# Patient Record
Sex: Female | Born: 1970 | Race: White | Hispanic: Yes | Marital: Married | Smoking: Never smoker
Health system: Southern US, Community
[De-identification: ages and names within clinical notes are randomized; demographics above are authoritative.]

## PROBLEM LIST (undated history)

## (undated) DIAGNOSIS — R87629 Unspecified abnormal cytological findings in specimens from vagina: Secondary | ICD-10-CM

## (undated) DIAGNOSIS — N63 Unspecified lump in unspecified breast: Secondary | ICD-10-CM

## (undated) DIAGNOSIS — T7840XA Allergy, unspecified, initial encounter: Secondary | ICD-10-CM

## (undated) DIAGNOSIS — F419 Anxiety disorder, unspecified: Secondary | ICD-10-CM

## (undated) HISTORY — PX: BREAST EXCISIONAL BIOPSY: SUR124

## (undated) HISTORY — PX: BREAST LUMPECTOMY: SHX2

## (undated) HISTORY — DX: Anxiety disorder, unspecified: F41.9

## (undated) HISTORY — DX: Unspecified abnormal cytological findings in specimens from vagina: R87.629

## (undated) HISTORY — PX: BREAST MASS EXCISION: SHX1267

## (undated) HISTORY — PX: NOSE SURGERY: SHX723

## (undated) HISTORY — DX: Allergy, unspecified, initial encounter: T78.40XA

---

## 2017-01-16 ENCOUNTER — Other Ambulatory Visit: Payer: Self-pay | Admitting: Medical

## 2017-01-16 DIAGNOSIS — Z1231 Encounter for screening mammogram for malignant neoplasm of breast: Secondary | ICD-10-CM

## 2017-01-17 ENCOUNTER — Ambulatory Visit (HOSPITAL_BASED_OUTPATIENT_CLINIC_OR_DEPARTMENT_OTHER)
Admission: RE | Admit: 2017-01-17 | Discharge: 2017-01-17 | Disposition: A | Discharge status: Home / Self Care | Payer: BLUE CROSS/BLUE SHIELD | Source: Ambulatory Visit | Attending: Medical | Admitting: Medical

## 2017-01-17 ENCOUNTER — Ambulatory Visit (INDEPENDENT_AMBULATORY_CARE_PROVIDER_SITE_OTHER): Payer: BLUE CROSS/BLUE SHIELD | Admitting: Medical

## 2017-01-17 ENCOUNTER — Encounter: Payer: Self-pay | Admitting: Medical

## 2017-01-17 ENCOUNTER — Encounter (HOSPITAL_BASED_OUTPATIENT_CLINIC_OR_DEPARTMENT_OTHER): Payer: Self-pay

## 2017-01-17 VITALS — BP 119/79 | HR 74 | Temp 98.4°F | Ht 59.0 in | Wt 124.6 lb

## 2017-01-17 DIAGNOSIS — F32A Depression, unspecified: Secondary | ICD-10-CM

## 2017-01-17 DIAGNOSIS — F329 Major depressive disorder, single episode, unspecified: Secondary | ICD-10-CM

## 2017-01-17 DIAGNOSIS — Z1231 Encounter for screening mammogram for malignant neoplasm of breast: Secondary | ICD-10-CM | POA: Insufficient documentation

## 2017-01-17 DIAGNOSIS — F419 Anxiety disorder, unspecified: Secondary | ICD-10-CM | POA: Diagnosis not present

## 2017-01-17 HISTORY — DX: Unspecified lump in unspecified breast: N63.0

## 2017-01-17 MED ORDER — ALPRAZOLAM 0.25 MG PO TABS: 0.2500 mg | ORAL_TABLET | Freq: Two times a day (BID) | ORAL | 0 refills | Status: DC | PRN

## 2017-01-17 MED ORDER — ESCITALOPRAM OXALATE 5 MG PO TABS: 5.0000 mg | ORAL_TABLET | Freq: Every day | ORAL | 3 refills | Status: DC

## 2017-01-17 NOTE — Progress Notes (Signed)
Subjective:    Patient ID: Deanna Valenzuela, female    DOB: Aug 14, 1971, 46 y.o.   MRN: [id number]  HPI I have reviewed pt PMH, PSH, FH, Social History and Surgical History.   Pt is not working, pt is tyring to walk or work out 3 times a week, trying to eat healthy, 1 cup of coffee in am. Pt has child 23 yo.  Pt states some recent stress fighting with her husband. Husband has been threatening to leave. 3 weeks ago when was at peak of stress and had chest pain and some back. Pt had normal ekg and cxr and bethany. Pt states no echo or ultrasound. Pt states her husband has been having employment problems and therefore very stressed and taking out frustration on her.(she feels). Pt states in past when was 46 yo had some depression and anxiety when her boyfriend left her.  Pt states 6 months ago had colitis. Pt went to [place]. She was treated and she states had negative colonoscopy(2018).  LMP- January 13, 2016.(no sex recently for many months).     Review of Systems  Constitutional: Negative for chills, fatigue and fever.  Respiratory: Negative for cough, choking, chest tightness, shortness of breath and wheezing.   Cardiovascular: Negative for chest pain and palpitations.  Gastrointestinal: Negative for abdominal pain, diarrhea, nausea and vomiting.  Musculoskeletal: Negative for back pain.  Skin: Negative for rash.  Neurological: Negative for dizziness, syncope, speech difficulty, weakness, light-headedness and headaches.  Hematological: Negative for adenopathy. Does not bruise/bleed easily.  Psychiatric/Behavioral: Positive for dysphoric mood. Negative for behavioral problems, confusion, sleep disturbance and suicidal ideas. The patient is nervous/anxious.     Past Medical History:  Diagnosis Date  . Allergy   . Anxiety      Social History   Social History  . Marital status: Married    Spouse name: N/A  . Number of children: N/A  . Years of education: N/A   Occupational History   . Not on file.   Social History Main Topics  . Smoking status: Never Smoker  . Smokeless tobacco: Never Used  . Alcohol use No  . Drug use: No  . Sexual activity: Yes   Other Topics Concern  . Not on file   Social History Narrative  . No narrative on file    Past Surgical History:  Procedure Laterality Date  . BREAST MASS EXCISION     Benign  . NOSE SURGERY     Asthetic    Family History  Problem Relation Age of Onset  . Hypertension Mother     Allergies not on file  No current outpatient prescriptions on file prior to visit.   No current facility-administered medications on file prior to visit.     BP 119/79   Pulse 74   Temp 98.4 F (36.9 C) (Oral)   Ht 4\' 11"  (1.499 m)   Wt 124 lb 9.6 oz (56.5 kg)   LMP 01/12/2017   SpO2 99%   BMI 25.17 kg/m       Objective:   Physical Exam  General Mental Status- Alert. General Appearance- Not in acute distress.   Skin General: Color- Normal Color. Moisture- Normal Moisture.  Neck Carotid Arteries- Normal color. Moisture- Normal Moisture. No carotid bruits. No JVD.  Chest and Lung Exam Auscultation: Breath Sounds:-Normal.  Cardiovascular Auscultation:Rythm- Regular. Murmurs & Other Heart Sounds:Auscultation of the heart reveals- No Murmurs.  Abdomen Inspection:-Inspeection Normal. Palpation/Percussion:Note:No mass. Palpation and Percussion of the abdomen reveal- Non  Tender, Non Distended + BS, no rebound or guarding.   Neurologic Cranial Nerve exam:- CN III-XII intact(No nystagmus), symmetric smile. Strength:- 5/5 equal and symmetric strength both upper and lower extremities.      Assessment & Plan:  For your stress, depression and anxiety will rx lexapro at dose of 50 mg a day. Also continue the xanax/alprozolam.  You can go ahead and schedule a complete physical today. Talk with [facility] spanish speaking staff to schedule.   Try to schedule CPE in one week or as needed  If any severe  recurrent chest pain then ED evaluation. If pain recurrent in future would refer to cardiologist. Currently will start to evaluate risk factors on CPE

## 2017-01-17 NOTE — Patient Instructions (Addendum)
For your stress, depression and anxiety will rx lexapro at dose of 50 mg a day. Also continue the xanax/alprozolam.  You can go ahead and schedule a complete physical today. Talk with [facility] spanish speaking staff to schedule.   Try to schedule CPE in one week or as needed

## 2017-01-17 NOTE — Progress Notes (Signed)
Pre visit review using our clinic tool,if applicable. No additional management support is needed unless otherwise documented below in the visit note.  

## 2017-01-22 ENCOUNTER — Telehealth: Payer: Self-pay | Admitting: Medical

## 2017-01-22 ENCOUNTER — Encounter: Payer: Self-pay | Admitting: Medical

## 2017-01-22 ENCOUNTER — Ambulatory Visit (INDEPENDENT_AMBULATORY_CARE_PROVIDER_SITE_OTHER): Payer: BLUE CROSS/BLUE SHIELD | Admitting: Medical

## 2017-01-22 VITALS — BP 128/83 | HR 105 | Temp 100.7°F | Resp 16 | Ht 59.0 in | Wt 124.5 lb

## 2017-01-22 DIAGNOSIS — R319 Hematuria, unspecified: Secondary | ICD-10-CM

## 2017-01-22 DIAGNOSIS — Z0000 Encounter for general adult medical examination without abnormal findings: Secondary | ICD-10-CM | POA: Diagnosis not present

## 2017-01-22 DIAGNOSIS — J209 Acute bronchitis, unspecified: Secondary | ICD-10-CM

## 2017-01-22 DIAGNOSIS — R8290 Unspecified abnormal findings in urine: Secondary | ICD-10-CM

## 2017-01-22 DIAGNOSIS — J440 Chronic obstructive pulmonary disease with acute lower respiratory infection: Secondary | ICD-10-CM | POA: Diagnosis not present

## 2017-01-22 DIAGNOSIS — Z113 Encounter for screening for infections with a predominantly sexual mode of transmission: Secondary | ICD-10-CM

## 2017-01-22 DIAGNOSIS — R6889 Other general symptoms and signs: Secondary | ICD-10-CM

## 2017-01-22 LAB — CBC WITH DIFFERENTIAL/PLATELET
Basophils Absolute: 0 10*3/uL (ref 0.0–0.1)
Basophils Relative: 0.7 % (ref 0.0–3.0)
EOS PCT: 0.2 % (ref 0.0–5.0)
Eosinophils Absolute: 0 10*3/uL (ref 0.0–0.7)
HCT: 39.1 % (ref 36.0–46.0)
HEMOGLOBIN: 13.2 g/dL (ref 12.0–15.0)
Lymphs Abs: 0.3 10*3/uL — ABNORMAL LOW (ref 0.7–4.0)
MCHC: 33.9 g/dL (ref 30.0–36.0)
MCV: 90 fl (ref 78.0–100.0)
MONO ABS: 1 10*3/uL (ref 0.1–1.0)
Monocytes Relative: 14.6 % — ABNORMAL HIGH (ref 3.0–12.0)
Neutro Abs: 5.4 10*3/uL (ref 1.4–7.7)
Neutrophils Relative %: 80.5 % — ABNORMAL HIGH (ref 43.0–77.0)
Platelets: 307 10*3/uL (ref 150.0–400.0)
RBC: 4.34 Mil/uL (ref 3.87–5.11)
RDW: 13.2 % (ref 11.5–15.5)
WBC: 6.7 10*3/uL (ref 4.0–10.5)

## 2017-01-22 LAB — POC URINALSYSI DIPSTICK (AUTOMATED)
Bilirubin, UA: NEGATIVE
GLUCOSE UA: NEGATIVE
Leukocytes, UA: NEGATIVE
Nitrite, UA: NEGATIVE
PROTEIN UA: NEGATIVE
SPEC GRAV UA: 1.02
UROBILINOGEN UA: NEGATIVE
pH, UA: 6

## 2017-01-22 LAB — COMPREHENSIVE METABOLIC PANEL
ALBUMIN: 4.4 g/dL (ref 3.5–5.2)
ALK PHOS: 72 U/L (ref 39–117)
ALT: 21 U/L (ref 0–35)
AST: 23 U/L (ref 0–37)
BUN: 7 mg/dL (ref 6–23)
CO2: 27 mEq/L (ref 19–32)
CREATININE: 0.68 mg/dL (ref 0.40–1.20)
Calcium: 9.2 mg/dL (ref 8.4–10.5)
Chloride: 97 mEq/L (ref 96–112)
GFR: 99.02 mL/min (ref >60.00)
Glucose, Bld: 94 mg/dL (ref 70–99)
Potassium: 3.9 mEq/L (ref 3.5–5.1)
SODIUM: 131 meq/L — AB (ref 135–145)
TOTAL PROTEIN: 7.9 g/dL (ref 6.0–8.3)
Total Bilirubin: 0.5 mg/dL (ref 0.2–1.2)

## 2017-01-22 LAB — LIPID PANEL
CHOL/HDL RATIO: 3
Cholesterol: 177 mg/dL (ref 0–200)
HDL: 66.2 mg/dL (ref >39.00)
LDL CALC: 97 mg/dL (ref 0–99)
NonHDL: 110.41
Triglycerides: 69 mg/dL (ref 0.0–149.0)
VLDL: 13.8 mg/dL (ref 0.0–40.0)

## 2017-01-22 LAB — POC INFLUENZA A&B (BINAX/QUICKVUE)
INFLUENZA B, POC: NEGATIVE
Influenza A, POC: NEGATIVE

## 2017-01-22 LAB — HIV ANTIBODY (ROUTINE TESTING W REFLEX): HIV: NONREACTIVE

## 2017-01-22 LAB — TSH: TSH: 0.56 u[IU]/mL (ref 0.35–4.50)

## 2017-01-22 LAB — POCT RAPID STREP A (OFFICE): RAPID STREP A SCREEN: NEGATIVE

## 2017-01-22 MED ORDER — OSELTAMIVIR PHOSPHATE 75 MG PO CAPS: 75.0000 mg | ORAL_CAPSULE | Freq: Two times a day (BID) | ORAL | 0 refills | Status: DC

## 2017-01-22 MED ORDER — AZITHROMYCIN 250 MG PO TABS: ORAL_TABLET | ORAL | 0 refills | Status: DC

## 2017-01-22 MED ORDER — BENZONATATE 100 MG PO CAPS: 100.0000 mg | ORAL_CAPSULE | Freq: Three times a day (TID) | ORAL | 0 refills | Status: DC | PRN

## 2017-01-22 NOTE — Patient Instructions (Addendum)
For you wellness exam today I have ordered cbc, cmp, tsh, lipid panel, ua and hiv.  No vaccine given today. Sick with flu likely.  Recommend exercise and healthy diet.  We will let you know lab results as they come in.  You have flu syndrome and also concern for bronchitis. Rx tamiflu, benzonatate for cough and azithromycin.  Follow up date appointment will be determined after lab review.    Cuidados preventivos en las mujeres de entre 44 y [phone] (Preventive Care 40-64 Years, Female) Los cuidados preventivos hacen referencia a las opciones en cuanto al estilo de vida y a las visitas al mdico, las cuales pueden promover la salud y Musician. QU INCLUYEN LOS CUIDADOS PREVENTIVOS?  Un examen fsico anual. Tambin se lo conoce como control de rutina anual.  Exmenes dentales una o dos veces al ao.  Exmenes oculares de [place]. Pregntele al mdico con qu frecuencia debe hacerse controles oculares.  Opciones personales en cuanto al estilo de vida, entre ellas:  Sparta y las piezas dentales.  Realizar actividad fsica con regularidad.  Consumir una dieta saludable.  Evitar el consumo de tabaco y de drogas.  Limitar el consumo de bebidas alcohlicas.  Counsellor.  Tomar aspirina de baja dosis diariamente a [employer].  Tomar los suplementos vitamnicos y [employer] se lo haya indicado el mdico. QU SUCEDE DURANTE UN CONTROL DE [place]?  Los servicios y las pruebas de deteccin que el mdico realiza durante un control de rutina anual dependern de la edad, el estado de salud general, los factores de riesgo relacionados con el estilo de vida y los antecedentes familiares de enfermedades. Psicoterapia  El mdico puede hacerle preguntas sobre lo siguiente:  Consumo de alcohol.  Consumo de tabaco.  Consumo de drogas.  Tryon familiar y en las relaciones.  Actividad  sexual.  Hbitos de alimentacin.  Trabajo y entorno laboral.  Mtodos anticonceptivos.  El ciclo menstrual.  Antecedentes de embarazos. Pruebas de deteccin  Pueden hacerle las siguientes pruebas o mediciones:  Estatura, peso e ndice de masa corporal [facility]).  La presin arterial.  Niveles de lpidos y colesterol. Estos se pueden controlar cada 5aos o con mayor frecuencia si tiene ms de 50aos.  Controles de la piel.  Pruebas de deteccin de cncer de pulmn. Pueden hacerle estas pruebas todos los aos a partir de los 55aos si ha fumado durante 30aos un paquete diario y sigue fumando o dej el hbito en algn momento en los ltimos 15aos.  Prueba de Personnel officer en las heces [facility]). Pueden hacerle esta prueba todos los aos a partir de [place].  Sigmoidoscopia flexible o colonoscopia. Pueden hacerle una sigmoidoscopia cada 47aos o una colonoscopia cada 10aos a partir de los 50aos.  Anlisis de sangre de hepatitisC.  Anlisis de sangre de hepatitisB.  Pruebas de deteccin de enfermedades de transmisin sexual (ETS).  Pruebas de deteccin de la diabetes. Para realizarlas, se controla el nivel de azcar en la sangre (glucemia) despus de que haya pasado un rato sin comer (ayuno). Pueden hacerle esta prueba cada 1 a 3aos.  Mamografa. Pueden hacerle este estudio cada 1 o 2aos. Hable con el mdico acerca de cundo debe comenzar a Nutritional therapist. Esto depende de si tiene antecedentes familiares de cncer de mama.  Pruebas de deteccin de tumores malignos relacionados con el BRCA. Se pueden realizar si tiene antecedentes familiares de cncer de mama, de ovarios, de trompas o  de peritoneo.  Examen plvico y prueba de Papanicolaou. Se pueden realizar cada 3aos, a partir de los 21aos. A partir de los 30aos, se pueden realizar cada 5aos si le hacen una prueba de Papanicolaou en combinacin con un anlisis del virus del Engineer, technical sales  (VPH).  Densitometra sea. Se realiza para detectar la presencia de osteoporosis. Pueden hacerle este estudio si corre un alto riesgo de tener osteoporosis. Hable con el mdico [employer], las opciones de tratamiento y, si es necesario, la necesidad de Optometrist ms [place]. Vacunas  El mdico puede recomendarle que se aplique algunas vacunas, por ejemplo:  [place] antigripal. Se recomienda su aplicacin todos los aos.  Vacuna contra la difteria, el ttanos y Research officer, trade union (Tdap, Td). Puede que tenga que aplicarse un refuerzo contra el ttanos y la difteria (Td) cada 10aos.  Vacuna contra la varicela. Es posible que tenga que aplicrsela si no recibi esta vacuna.  Vacuna contra el herpes zster. Tal vez deba aplicrsela despus de los 60aos.  Vacuna contra el sarampin, la rubola y las paperas ([place]). Tal vez deba aplicarse al menos una dosis de SRP si naci [phone] o despus de ese ao. Podra tambin necesitar una segunda dosis.  Vacuna antineumoccica conjugada 13valente (PCV13). Puede necesitar esta vacuna si tiene determinadas enfermedades y no se vacun anteriormente.  Vacuna antineumoccica de polisacridos (PPSV23). Quizs tenga que aplicarse una o dos dosis si fuma o si sufre determinadas enfermedades.  Vacuna antimeningoccica. Puede necesitar esta vacuna si tiene determinadas enfermedades.  Vacuna contra la hepatitis A. Es posible que tenga que aplicarse esta vacuna si tiene determinadas enfermedades, o si trabaja en lugares donde puede estar expuesta a la hepatitisA o viaja a estas zonas.  Vacuna contra la hepatitis B. Es posible que tenga que aplicarse esta vacuna si tiene determinadas enfermedades, o si trabaja en lugares donde puede estar expuesta a la hepatitisB o viaja a estas zonas.  Vacuna antihaemophilus influenzae tipoB (Hib). Puede necesitar esta vacuna si tiene determinadas enfermedades. Hable con el [place] deteccin y las vacunas que tiene que [place], y con qu frecuencia debe hacerlo. Esta informacin no tiene Marine scientist el consejo del mdico. Asegrese de hacerle al mdico cualquier pregunta que tenga. Document Reviewed: 09/06/2015 Elsevier Interactive Patient Education  2017 [employer].

## 2017-01-22 NOTE — Telephone Encounter (Signed)
Notified pt. Also states she is running fever and wanted to know if she could take tylenol. Advised her ok to take tylenol per bottle directions and pt voices understanding. Please advise if any other recommendations?

## 2017-01-22 NOTE — Telephone Encounter (Signed)
For body aches or fever she can use tylenol every 6 hours and 2 hours later if fever or body aches persist she can take ibuprofen.  Thanks  For your help with my patients.

## 2017-01-22 NOTE — Telephone Encounter (Signed)
Caller name: Reeves Damna Vasco Relationship to patient: Can be [phone]  Reason for call:Wants to know if she can take nasal spray with tamiflu

## 2017-01-22 NOTE — Telephone Encounter (Signed)
Ok to take tamiflu with nasal spray.Notify patient.

## 2017-01-22 NOTE — Progress Notes (Signed)
Subjective:    Patient ID: Deanna Valenzuela, female    DOB: 23-Apr-1971, 46 y.o.   MRN: [id number]  HPI  I have reviewed pt PMH, PSH, FH, Social History and Surgical History.   Pt is not working, pt is tyring to walk or work out 3 times a week, trying to eat healthy, 1 cup of coffee in am. Pt has child 76 yo. Pt does not smoke.  See ros below and note.  Pt had pap in 10-2016. Pap was normal.  Pt had mammogram last week and was negative.  Pt is fasting today.  Pt did not get flu vaccine this year.      (originally here for cpe but sick as well)Pt states yesterday began with cough and fever. Pt has some body aches. Pt husband had flu recently. Pt has productive cough. Mild loose stool yesterday. Pt has some faint st today. Patient has fever.   LMP- 01-12-2017.     Review of Systems  Constitutional: Positive for fever. Negative for chills and fatigue.  HENT: Positive for congestion, postnasal drip and sore throat. Negative for sinus pain, sinus pressure and sneezing.   Respiratory: Positive for cough. Negative for chest tightness, shortness of breath and wheezing.   Cardiovascular: Negative for chest pain and palpitations.  Gastrointestinal: Negative for abdominal pain, anal bleeding, diarrhea and rectal pain.  Genitourinary: Negative for enuresis.  Musculoskeletal: Positive for myalgias. Negative for back pain, neck pain and neck stiffness.  Skin: Negative for rash.  Neurological: Negative for seizures, facial asymmetry, speech difficulty, weakness and light-headedness.  Hematological: Negative for adenopathy. Does not bruise/bleed easily.  Psychiatric/Behavioral: Negative for behavioral problems and confusion. The patient is nervous/anxious. The patient is not hyperactive.        Continue marital problems.    Past Medical History:  Diagnosis Date  . Allergy   . Anxiety   . Breast mass    benign mass removed 14-15 years ago from left breast      Social History   Social  History  . Marital status: Married    Spouse name: N/A  . Number of children: N/A  . Years of education: N/A   Occupational History  . Not on file.   Social History Main Topics  . Smoking status: Never Smoker  . Smokeless tobacco: Never Used  . Alcohol use No  . Drug use: No  . Sexual activity: Yes   Other Topics Concern  . Not on file   Social History Narrative  . No narrative on file    Past Surgical History:  Procedure Laterality Date  . BREAST EXCISIONAL BIOPSY    . BREAST LUMPECTOMY    . BREAST MASS EXCISION     Benign  . NOSE SURGERY     Asthetic    Family History  Problem Relation Age of Onset  . Hypertension Mother     Allergies  Allergen Reactions  . Lidocaine Palpitations    Injections at Dentist    Current Outpatient Prescriptions on File Prior to Visit  Medication Sig Dispense Refill  . ALPRAZolam (XANAX) 0.25 MG tablet Take 1 tablet (0.25 mg total) by mouth 2 (two) times daily as needed for anxiety. 60 tablet 0  . escitalopram (LEXAPRO) 5 MG tablet Take 1 tablet (5 mg total) by mouth daily. (Patient not taking: Reported on 01/22/2017) 30 tablet 3   No current facility-administered medications on file prior to visit.     BP 128/83 (BP Location: Left Arm, Patient  Position: Sitting, Cuff Size: Normal)   Pulse (!) 105   Temp (!) 100.7 F (38.2 C) (Oral)   Resp 16   Ht 4\' 11"  (1.499 m)   Wt 124 lb 8 oz (56.5 kg)   LMP 01/12/2017   SpO2 98%   BMI 25.15 kg/m       Objective:   Physical Exam   General Mental Status- Alert. General Appearance- Not in acute distress.     HEENT Head- Normal. Ear Auditory Canal - Left- Normal. Right - Normal.Tympanic Membrane- Left- Normal. Right- Normal. Eye Sclera/Conjunctiva- Left- Normal. Right- Normal. Nose & Sinuses Nasal Mucosa- Left-  Boggy and Congested. Right-  Boggy and  Congested.Bilateral maxillary and frontal sinus pressure. Mouth & Throat Lips: Upper Lip- Normal: no dryness, cracking,  pallor, cyanosis, or vesicular eruption. Lower Lip-Normal: no dryness, cracking, pallor, cyanosis or vesicular eruption. Buccal Mucosa- Bilateral- No Aphthous ulcers. Oropharynx- No Discharge or Erythema. Tonsils: Characteristics- Bilateral- No Erythema or Congestion. Size/Enlargement- Bilateral- No enlargement. Discharge- bilateral-None.   Skin General: Color- Normal Color. Moisture- Normal Moisture.  Neck Carotid Arteries- Normal color. Moisture- Normal Moisture. No carotid bruits. No JVD.  Chest and Lung Exam Auscultation: Breath Sounds:-Normal.  Cardiovascular Auscultation:Rythm- Regular. Murmurs & Other Heart Sounds:Auscultation of the heart reveals- No Murmurs.  Abdomen Inspection:-Inspeection Normal. Palpation/Percussion:Note:No mass. Palpation and Percussion of the abdomen reveal- Non Tender, Non Distended + BS, no rebound or guarding.   Neurologic Cranial Nerve exam:- CN III-XII intact(No nystagmus), symmetric smile. Strength:- 5/5 equal and symmetric strength both upper and lower extremities.     Assessment & Plan:  For you wellness exam today I have ordered cbc, cmp, tsh, lipid panel, ua and hiv.  No vaccine given today. Sick with flu likely.  Recommend exercise and healthy diet.  We will let you know lab results as they come in.  You have flu syndrome and also concern for bronchitis. Rx tamiflu, benzonatate for cough and azithromycin.  Follow up date appointment will be determined after lab review.  I advised pt that I am also charging her for sick visit in addition to wellness exam  Lindley Stachnik, Ramon Dred[place]ward, [place] JerseyPA-C

## 2017-01-22 NOTE — Telephone Encounter (Signed)
Notified pt. 

## 2017-01-22 NOTE — Progress Notes (Signed)
Pre visit review using our clinic review tool, if applicable. No additional management support is needed unless otherwise documented below in the visit note/SLS  

## 2017-01-22 NOTE — Telephone Encounter (Signed)
Future urine placed to recheck for blood.

## 2017-01-23 ENCOUNTER — Telehealth: Payer: Self-pay | Admitting: Medical

## 2017-01-23 DIAGNOSIS — Z0000 Encounter for general adult medical examination without abnormal findings: Secondary | ICD-10-CM

## 2017-01-23 LAB — URINE CULTURE

## 2017-01-23 NOTE — Telephone Encounter (Signed)
-----   Message from Esperanza RichtersEdward Saguier, PA-C sent at 01/22/2017  9:27 PM EST ----- Pt has some blood in her urine. Was she on her menstrual cycle? Will put in future urine order  to repeat in one week to see if any blood is persisting. Pt last menstrual cycle was Jan 12, 2017 on chart review. If blood persist on urine dip and appears not related to menstrual cycle related  then consider referral to urologist. Hiv test was negative. Pt has moderate low sodium. Sometimes overhydration with water can lower sodium. If she purposefully restricting salt in her diet advise her she can have more salt in her diet. Good kidney function. Normal liver enzymes. Normal/good cholesterol levels. thyroid test are normal.No anemia on lab review.Normal infection fighting cells.

## 2017-01-23 NOTE — Telephone Encounter (Signed)
Pt was informed the below and understood,  pt states had menstrual during Feb 24 til around Feb 28. Pt scheduled her lab appt for Friday February 01, 2017. Please place lab order.

## 2017-01-25 ENCOUNTER — Telehealth: Payer: Self-pay | Admitting: Medical

## 2017-01-25 ENCOUNTER — Other Ambulatory Visit: Payer: BLUE CROSS/BLUE SHIELD

## 2017-01-25 NOTE — Telephone Encounter (Signed)
Pt called in to be advised. Pt says that she was seen and prescribed a medication for her cough. Pt says that she still have her cough. She would like to be advised on what she could do next?   [phone]

## 2017-01-25 NOTE — Telephone Encounter (Signed)
Will you call pt and advise she can take 2 of the benzonatate tablets every 8 hours. 200 mg is the max dose. I wrote her for 100 mg capules. If cough persists by Monday then could write narcotic based cough medicine but that would need to be picked up in the office.

## 2017-01-25 NOTE — Telephone Encounter (Signed)
Pt was informed the below and understood (pt will take 2 tablets of benzonatate every 8 hours), pt stated will call if she is still having  issue with cough by Monday.

## 2017-01-28 MED ORDER — HYDROCODONE-HOMATROPINE 5-1.5 MG/5ML PO SYRP: 5.0000 mL | ORAL_SOLUTION | Freq: Three times a day (TID) | ORAL | 0 refills | Status: DC | PRN

## 2017-01-28 NOTE — Telephone Encounter (Signed)
Attempt to reach pt via phone, "voice mail box not yet set up"/SLS 03/12

## 2017-01-28 NOTE — Telephone Encounter (Signed)
I am printing hycodan rx. Will you call her advise she can pick up today or tomorrow. You know what time we will be here today. So can advise her maybe tomorrow.

## 2017-01-28 NOTE — Telephone Encounter (Signed)
Patient is still having a cough, [facility]

## 2017-01-29 ENCOUNTER — Telehealth: Payer: Self-pay | Admitting: Medical

## 2017-01-29 NOTE — Telephone Encounter (Signed)
Pt was informed the below (the rx ready to pick up at front desk) Pt states probably will sent her husband Tonia BroomsGustavo Pena to pick up her meds if she is not able to come to the office.

## 2017-01-29 NOTE — Telephone Encounter (Signed)
Duplicate Note, this issue has been handled via note from 01/25/17.

## 2017-01-29 NOTE — Telephone Encounter (Signed)
Caller name: Relationship to patient:Self Can be reached: [phone]  Pharmacy:  [id number] [place], [place] - [address] AT [address]OSS [address] [phone] (Phone) [phone] (Fax)    Reason for call: Request a Rx for a cough medication. States she still has a bad cough

## 2017-02-01 ENCOUNTER — Other Ambulatory Visit: Payer: BLUE CROSS/BLUE SHIELD

## 2017-02-07 ENCOUNTER — Ambulatory Visit: Payer: BLUE CROSS/BLUE SHIELD | Admitting: Medical

## 2017-02-08 ENCOUNTER — Ambulatory Visit: Payer: BLUE CROSS/BLUE SHIELD | Admitting: Medical

## 2017-02-13 ENCOUNTER — Ambulatory Visit (INDEPENDENT_AMBULATORY_CARE_PROVIDER_SITE_OTHER): Payer: BLUE CROSS/BLUE SHIELD | Admitting: Medical

## 2017-02-13 ENCOUNTER — Encounter: Payer: Self-pay | Admitting: Medical

## 2017-02-13 ENCOUNTER — Ambulatory Visit (HOSPITAL_BASED_OUTPATIENT_CLINIC_OR_DEPARTMENT_OTHER)
Admission: RE | Admit: 2017-02-13 | Discharge: 2017-02-13 | Disposition: A | Discharge status: Home / Self Care | Payer: BLUE CROSS/BLUE SHIELD | Source: Ambulatory Visit | Attending: Medical | Admitting: Medical

## 2017-02-13 VITALS — BP 118/66 | HR 65 | Temp 98.2°F | Resp 16 | Ht 59.0 in | Wt 123.5 lb

## 2017-02-13 DIAGNOSIS — R0789 Other chest pain: Secondary | ICD-10-CM | POA: Diagnosis not present

## 2017-02-13 DIAGNOSIS — R05 Cough: Secondary | ICD-10-CM | POA: Insufficient documentation

## 2017-02-13 DIAGNOSIS — R8290 Unspecified abnormal findings in urine: Secondary | ICD-10-CM | POA: Diagnosis not present

## 2017-02-13 DIAGNOSIS — R059 Cough, unspecified: Secondary | ICD-10-CM

## 2017-02-13 DIAGNOSIS — R350 Frequency of micturition: Secondary | ICD-10-CM

## 2017-02-13 LAB — POC URINALSYSI DIPSTICK (AUTOMATED)
BILIRUBIN UA: NEGATIVE
Blood, UA: NEGATIVE
Glucose, UA: NEGATIVE
KETONES UA: NEGATIVE
Leukocytes, UA: NEGATIVE
Nitrite, UA: NEGATIVE
PH UA: 7 (ref 5.0–8.0)
PROTEIN UA: NEGATIVE
Spec Grav, UA: 1.025 (ref 1.030–1.035)
Urobilinogen, UA: 0.2 (ref ?–2.0)

## 2017-02-13 LAB — TROPONIN I: TNIDX: 0.01 ug/l (ref 0.00–0.06)

## 2017-02-13 NOTE — Patient Instructions (Addendum)
Your blood in urine has cleared now.  For frequent urination will get urine culture.  For cough can use delysm otc and will get chest xray. Cough may be residual post flu.  For anxiety continue xanax. Dc citalopram due to side effect.  For atypical chest pain we got ekg today. Looks ok. The rare transient pain may be related to extreme stress. Continue xanax. Stop citalopram. If you have severe constant type chest pain then ED evaluation. If mild rare intermittent chest pain occurs will need to refer you to cardiologist.  Follow up 1 month or as needed

## 2017-02-13 NOTE — Progress Notes (Signed)
Pre visit review using our clinic review tool, if applicable. No additional management support is needed unless otherwise documented below in the visit note/SLS  

## 2017-02-13 NOTE — Progress Notes (Signed)
Subjective:    Patient ID: Deanna Valenzuela, female    DOB: 01/07/1971, 46 y.o.   MRN: [id number]  HPI  Pt in for follow up. She had moderate blood in her urine. Found on physical exam/wellness labs. Pt has never smoked in her life. Pt thinks that her blood may have been related to her menstrual cycle  Pt does report some occasional frequent urination.  But no other urine infection type signs or symptoms.  Pt has residual occasional cough post flu. But no fever, no chills or sweats.  Pt still anxiety about her husband infidelity. Pt states took citalopram the other day and caused her nausea. But in past it worked and she never had such nausea. She stopped citalopram nausea stopped. Pt states when she stresses she will have occasion all rare transient chest pain for seconds. No associated cardiac type symptoms.  Pt takes xanax and tyelenol will stop her transient chest wall pain.  LMP- 3-20-208 normal cycle.  Review of Systems  Constitutional: Negative for chills, fatigue and fever.  HENT: Negative for congestion, ear pain, postnasal drip, rhinorrhea, sinus pain and sinus pressure.   Respiratory: Positive for cough. Negative for chest tightness, shortness of breath and wheezing.   Cardiovascular: Negative for chest pain and palpitations.       This am faint mild transient chest pain about 4 hours ago. No recurrence.  Gastrointestinal: Negative for abdominal pain.  Genitourinary: Positive for frequency. Negative for decreased urine volume, difficulty urinating, dysuria, hematuria, urgency and vaginal pain.  Musculoskeletal: Negative for back pain and myalgias.  Psychiatric/Behavioral: Negative for behavioral problems, dysphoric mood, sleep disturbance and suicidal ideas. The patient is nervous/anxious.     Past Medical History:  Diagnosis Date  . Allergy   . Anxiety   . Breast mass    benign mass removed 14-15 years ago from left breast      Social History   Social History  . Marital  status: Married    Spouse name: N/A  . Number of children: N/A  . Years of education: N/A   Occupational History  . Not on file.   Social History Main Topics  . Smoking status: Never Smoker  . Smokeless tobacco: Never Used  . Alcohol use No  . Drug use: No  . Sexual activity: Yes   Other Topics Concern  . Not on file   Social History Narrative  . No narrative on file    Past Surgical History:  Procedure Laterality Date  . BREAST EXCISIONAL BIOPSY    . BREAST LUMPECTOMY    . BREAST MASS EXCISION     Benign  . NOSE SURGERY     Asthetic    Family History  Problem Relation Age of Onset  . Hypertension Mother     Allergies  Allergen Reactions  . Lidocaine Palpitations    Injections at Dentist    Current Outpatient Prescriptions on File Prior to Visit  Medication Sig Dispense Refill  . ALPRAZolam (XANAX) 0.25 MG tablet Take 1 tablet (0.25 mg total) by mouth 2 (two) times daily as needed for anxiety. 60 tablet 0  . escitalopram (LEXAPRO) 5 MG tablet Take 1 tablet (5 mg total) by mouth daily. (Patient not taking: Reported on 01/22/2017) 30 tablet 3   No current facility-administered medications on file prior to visit.     BP 118/66 (BP Location: Left Arm, Patient Position: Sitting, Cuff Size: Normal)   Pulse 65   Temp 98.2 F (36.8 C) (Oral)  Resp 16   Ht 4\' 11"  (1.499 m)   Wt 123 lb 8 oz (56 kg)   LMP 02/05/2017   SpO2 99%   BMI 24.94 kg/m       Objective:   Physical Exam  General Mental Status- Alert. General Appearance- Not in acute distress.   Skin General: Color- Normal Color. Moisture- Normal Moisture.  Neck Carotid Arteries- Normal color. Moisture- Normal Moisture. No carotid bruits. No JVD.  Chest and Lung Exam Auscultation: Breath Sounds:-Normal.  Cardiovascular Auscultation:Rythm- Regular. Murmurs & Other Heart Sounds:Auscultation of the heart reveals- No Murmurs.  Abdomen Inspection:-Inspeection  Normal. Palpation/Percussion:Note:No mass. Palpation and Percussion of the abdomen reveal- Non Tender, Non Distended + BS, no rebound or guarding.  Neurologic Cranial Nerve exam:- CN III-XII intact(No nystagmus), symmetric smile. tStrength:- 5/5 equal and symmetric strength both upper and lower extremities.      Assessment & Plan:  Ekg reviewed and showed sinus rhythm. But mid slow heart rate. Done after resting a while. So likely normal.  Your blood in urine has cleared now.  For frequent urination will get urine culture.  For cough can use delysm otc and will get chest xray. Cough may be residual post flu.  For anxiety continue xanax. Dc citalopram due to side effect  For atypical chest pain we got ekg today. Looks ok. The rare transient pain may be related to extreme stress. Continue xanax. Stop citalopram. If you have severe constant type chest pain then ED evaluation. If mild rare intermittent chest pain occurs will need to refer you to cardiologist.  Follow up 1 month or as needed  Deanna Valenzuela, Deanna Loughry, PA-C   Pt phone number is [phone].

## 2017-02-14 ENCOUNTER — Telehealth: Payer: Self-pay | Admitting: Medical

## 2017-02-14 NOTE — Telephone Encounter (Addendum)
-----   Message from Esperanza RichtersEdward Saguier, PA-C sent at 02/13/2017 12:44 PM EDT ----- Notify pt that her xray results and pt also understood.

## 2017-02-14 NOTE — Telephone Encounter (Signed)
-----   Message from Deanna RichtersEdward Saguier, PA-C sent at 02/13/2017  8:02 PM EDT ----- Heart protein study was negative/not elevated. Notify pt please.

## 2017-02-14 NOTE — Telephone Encounter (Signed)
Pt was also notify about her xrays results.

## 2017-02-14 NOTE — Telephone Encounter (Signed)
Pt was informed the below. And understood.

## 2017-02-16 LAB — CULTURE, URINE COMPREHENSIVE

## 2017-02-19 ENCOUNTER — Telehealth: Payer: Self-pay | Admitting: Medical

## 2017-02-19 NOTE — Telephone Encounter (Signed)
Pt was informed the below and pt states that the day she did her UDS she made sure that she wiped herself very well with the wipes and took her urine, so pt thinks there was no contamination. Pt also mentioned that she feels a lot better but does want to know if she has to take anything for the some bacterias found in her urine test. Please advise.

## 2017-02-19 NOTE — Telephone Encounter (Signed)
-----   Message from Esperanza Richters, PA-C sent at 02/17/2017  3:44 PM EDT ----- Pt has some bacteria in urine. Very low count that indicates probably contaminated sample. How does she feel? Any symptoms?

## 2017-03-21 ENCOUNTER — Telehealth: Payer: Self-pay | Admitting: Medical

## 2017-03-21 NOTE — Telephone Encounter (Signed)
Pt reports bad smell when urinating,pain and she's urinating more frequently. Pt thinks she may have a UTI. Pt wants to know what to know if she needs an antibiotic. Please advise

## 2017-03-21 NOTE — Telephone Encounter (Signed)
Recommend office visit tomorrow before the weekend so we can get culture and evaluate ua to see if antibiotic needed.

## 2017-03-21 NOTE — Telephone Encounter (Signed)
Caller name: Relationship to patient: Self Can be reached: [phone] Pharmacy:  Reason for call: Patient request call back to discuss infection

## 2017-03-22 NOTE — Telephone Encounter (Signed)
Pt states she is not have symptoms today. She will call  next week and make an appointment if symptoms start back.

## 2017-04-16 ENCOUNTER — Telehealth: Payer: Self-pay | Admitting: Medical

## 2017-04-16 MED ORDER — ALPRAZOLAM 0.25 MG PO TABS: 0.2500 mg | ORAL_TABLET | Freq: Two times a day (BID) | ORAL | 0 refills | Status: DC | PRN

## 2017-04-16 NOTE — Telephone Encounter (Signed)
CAN SIGN CONTRACT AND GIVE UDS WHEN SHE PICKS UP RX FOR XANAX. Have jackelyn help explain. Pt is spanish speaker.

## 2017-04-16 NOTE — Telephone Encounter (Signed)
Caller name: Darien Ramusna  Relation to pt: self Call back number: [phone] Pharmacy:  Reason for call: Pt came in office stating if possible wants to have a printed out lab results from her last cpe appt. (pt states since she is in the building at daughters appt- pt would like to be able to pick up copies of labs today at 12:00 am) Pt is also requesting refill on meds for ALPRAZolam (XANAX) 0.25 MG tablet. Please advise.

## 2017-04-16 NOTE — Telephone Encounter (Signed)
Pt was called and informed that her rx is at front desk and that she needed to sign contract, pt will come in this wk or next wk.

## 2017-04-16 NOTE — Telephone Encounter (Signed)
Pt was called and informed the below. Pt understood and will pick up rx during the wk.

## 2017-04-16 NOTE — Telephone Encounter (Signed)
Refill Request: Alprazolam  Last RX:01/17/17 Last OV:02/13/17 Next UJ:[id number]:None Scheduled  UDS: No UDS CSC: No Contract

## 2017-11-08 ENCOUNTER — Telehealth: Payer: Self-pay | Admitting: Medical

## 2017-11-08 NOTE — Telephone Encounter (Signed)
Rx for XANAX dated 04/16/17 was not picked up. Document was shredded.

## 2017-12-27 ENCOUNTER — Other Ambulatory Visit (HOSPITAL_BASED_OUTPATIENT_CLINIC_OR_DEPARTMENT_OTHER): Payer: Self-pay | Admitting: Internal Medicine

## 2017-12-27 DIAGNOSIS — Z1231 Encounter for screening mammogram for malignant neoplasm of breast: Secondary | ICD-10-CM

## 2018-01-18 ENCOUNTER — Ambulatory Visit (HOSPITAL_BASED_OUTPATIENT_CLINIC_OR_DEPARTMENT_OTHER): Payer: BLUE CROSS/BLUE SHIELD

## 2018-03-10 ENCOUNTER — Encounter (HOSPITAL_BASED_OUTPATIENT_CLINIC_OR_DEPARTMENT_OTHER): Payer: Self-pay

## 2018-03-10 ENCOUNTER — Ambulatory Visit (HOSPITAL_BASED_OUTPATIENT_CLINIC_OR_DEPARTMENT_OTHER)
Admission: RE | Admit: 2018-03-10 | Discharge: 2018-03-10 | Disposition: A | Discharge status: Home / Self Care | Payer: BLUE CROSS/BLUE SHIELD | Source: Ambulatory Visit | Attending: Internal Medicine | Admitting: Internal Medicine

## 2018-03-10 DIAGNOSIS — R928 Other abnormal and inconclusive findings on diagnostic imaging of breast: Secondary | ICD-10-CM | POA: Insufficient documentation

## 2018-03-10 DIAGNOSIS — Z1231 Encounter for screening mammogram for malignant neoplasm of breast: Secondary | ICD-10-CM | POA: Diagnosis not present

## 2018-03-11 ENCOUNTER — Other Ambulatory Visit: Payer: Self-pay | Admitting: Internal Medicine

## 2018-03-11 DIAGNOSIS — R928 Other abnormal and inconclusive findings on diagnostic imaging of breast: Secondary | ICD-10-CM

## 2018-03-18 ENCOUNTER — Ambulatory Visit
Admission: RE | Admit: 2018-03-18 | Discharge: 2018-03-18 | Disposition: A | Discharge status: Home / Self Care | Payer: BLUE CROSS/BLUE SHIELD | Source: Ambulatory Visit | Attending: Internal Medicine | Admitting: Internal Medicine

## 2018-03-18 ENCOUNTER — Other Ambulatory Visit: Payer: BLUE CROSS/BLUE SHIELD

## 2018-03-18 DIAGNOSIS — R928 Other abnormal and inconclusive findings on diagnostic imaging of breast: Secondary | ICD-10-CM

## 2018-05-14 ENCOUNTER — Encounter: Payer: Self-pay | Admitting: Family Medicine

## 2018-05-14 ENCOUNTER — Ambulatory Visit (INDEPENDENT_AMBULATORY_CARE_PROVIDER_SITE_OTHER): Payer: BLUE CROSS/BLUE SHIELD | Admitting: Family Medicine

## 2018-05-14 DIAGNOSIS — Z01419 Encounter for gynecological examination (general) (routine) without abnormal findings: Secondary | ICD-10-CM

## 2018-05-14 DIAGNOSIS — Z1151 Encounter for screening for human papillomavirus (HPV): Secondary | ICD-10-CM

## 2018-05-14 DIAGNOSIS — Z124 Encounter for screening for malignant neoplasm of cervix: Secondary | ICD-10-CM

## 2018-05-14 NOTE — Patient Instructions (Signed)
Preventive Care 40-64 Years, Female Preventive care refers to lifestyle choices and visits with your health care provider that can promote health and wellness. What does preventive care include?  A yearly physical exam. This is also called an annual well check.  Dental exams once or twice a year.  Routine eye exams. Ask your health care provider how often you should have your eyes checked.  Personal lifestyle choices, including: ? Daily care of your teeth and gums. ? Regular physical activity. ? Eating a healthy diet. ? Avoiding tobacco and drug use. ? Limiting alcohol use. ? Practicing safe sex. ? Taking low-dose aspirin daily starting at age 58. ? Taking vitamin and mineral supplements as recommended by your health care provider. What happens during an annual well check? The services and screenings done by your health care provider during your annual well check will depend on your age, overall health, lifestyle risk factors, and family history of disease. Counseling Your health care provider may ask you questions about your:  Alcohol use.  Tobacco use.  Drug use.  Emotional well-being.  Home and relationship well-being.  Sexual activity.  Eating habits.  Work and work Statistician.  Method of birth control.  Menstrual cycle.  Pregnancy history.  Screening You may have the following tests or measurements:  Height, weight, and BMI.  Blood pressure.  Lipid and cholesterol levels. These may be checked every 5 years, or more frequently if you are over 81 years old.  Skin check.  Lung cancer screening. You may have this screening every year starting at age 78 if you have a 30-pack-year history of smoking and currently smoke or have quit within the past 15 years.  Fecal occult blood test (FOBT) of the stool. You may have this test every year starting at age 65.  Flexible sigmoidoscopy or colonoscopy. You may have a sigmoidoscopy every 5 years or a colonoscopy  every 10 years starting at age 30.  Hepatitis C blood test.  Hepatitis B blood test.  Sexually transmitted disease (STD) testing.  Diabetes screening. This is done by checking your blood sugar (glucose) after you have not eaten for a while (fasting). You may have this done every 1-3 years.  Mammogram. This may be done every 1-2 years. Talk to your health care provider about when you should start having regular mammograms. This may depend on whether you have a family history of breast cancer.  BRCA-related cancer screening. This may be done if you have a family history of breast, ovarian, tubal, or peritoneal cancers.  Pelvic exam and Pap test. This may be done every 3 years starting at age 80. Starting at age 36, this may be done every 5 years if you have a Pap test in combination with an HPV test.  Bone density scan. This is done to screen for osteoporosis. You may have this scan if you are at high risk for osteoporosis.  Discuss your test results, treatment options, and if necessary, the need for more tests with your health care provider. Vaccines Your health care provider may recommend certain vaccines, such as:  Influenza vaccine. This is recommended every year.  Tetanus, diphtheria, and acellular pertussis (Tdap, Td) vaccine. You may need a Td booster every 10 years.  Varicella vaccine. You may need this if you have not been vaccinated.  Zoster vaccine. You may need this after age 5.  Measles, mumps, and rubella (MMR) vaccine. You may need at least one dose of MMR if you were born in  1957 or later. You may also need a second dose.  Pneumococcal 13-valent conjugate (PCV13) vaccine. You may need this if you have certain conditions and were not previously vaccinated.  Pneumococcal polysaccharide (PPSV23) vaccine. You may need one or two doses if you smoke cigarettes or if you have certain conditions.  Meningococcal vaccine. You may need this if you have certain  conditions.  Hepatitis A vaccine. You may need this if you have certain conditions or if you travel or work in places where you may be exposed to hepatitis A.  Hepatitis B vaccine. You may need this if you have certain conditions or if you travel or work in places where you may be exposed to hepatitis B.  Haemophilus influenzae type b (Hib) vaccine. You may need this if you have certain conditions.  Talk to your health care provider about which screenings and vaccines you need and how often you need them. This information is not intended to replace advice given to you by your health care provider. Make sure you discuss any questions you have with your health care provider. Document Released: 12/02/2015 Document Revised: 07/25/2016 Document Reviewed: 09/06/2015 Elsevier Interactive Patient Education  2018 Elsevier Inc.  

## 2018-05-14 NOTE — Progress Notes (Signed)
Subjective:     Deanna Valenzuela is a 47 y.o. female and is here for a comprehensive physical exam. The patient reports no problems. Past Medical History:  Diagnosis Date  . Allergy   . Anxiety   . Breast mass    benign mass removed 14-15 years ago from left breast   . Vaginal Pap smear, abnormal    Past Surgical History:  Procedure Laterality Date  . BREAST EXCISIONAL BIOPSY    . BREAST LUMPECTOMY    . BREAST MASS EXCISION     Benign  . NOSE SURGERY     Asthetic   Family History  Problem Relation Age of Onset  . Hypertension Mother   . Breast cancer Maternal Aunt    Social History   Tobacco Use  . Smoking status: Never Smoker  . Smokeless tobacco: Never Used  Substance Use Topics  . Alcohol use: No   Outpatient Encounter Medications as of 05/14/2018  Medication Sig  . ciprofloxacin (CIPRO) 500 MG tablet   . [DISCONTINUED] ALPRAZolam (XANAX) 0.25 MG tablet Take 1 tablet (0.25 mg total) by mouth 2 (two) times daily as needed for anxiety. (Patient not taking: Reported on 05/14/2018)  . [DISCONTINUED] escitalopram (LEXAPRO) 5 MG tablet Take 1 tablet (5 mg total) by mouth daily. (Patient not taking: Reported on 01/22/2017)   No facility-administered encounter medications on file as of 05/14/2018.    Allergies  Allergen Reactions  . Lidocaine Palpitations    Injections at Dentist    Health Maintenance  Topic Date Due  . INFLUENZA VACCINE  06/19/2018  . PAP SMEAR  10/20/2019  . TETANUS/TDAP  11/19/2022  . HIV Screening  Completed    The following portions of the patient's history were reviewed and updated as appropriate: allergies, current medications, past family history, past medical history, past social history, past surgical history and problem list.  Review of Systems Pertinent items noted in HPI and remainder of comprehensive ROS otherwise negative.   Objective:    BP 107/70   Pulse 70   Ht 5' (1.524 m)   Wt 127 lb (57.6 kg)   LMP 04/23/2018   BMI 24.80  kg/m  General appearance: alert, cooperative and appears stated age Head: Normocephalic, without obvious abnormality, atraumatic Neck: no adenopathy, supple, symmetrical, trachea midline and thyroid not enlarged, symmetric, no tenderness/mass/nodules Lungs: clear to auscultation bilaterally Breasts: normal appearance, no masses or tenderness Heart: regular rate and rhythm, S1, S2 normal, no murmur, click, rub or gallop Abdomen: soft, non-tender; bowel sounds normal; no masses,  no organomegaly Pelvic: cervix normal in appearance, external genitalia normal, no adnexal masses or tenderness, no cervical motion tenderness, uterus normal size, shape, and consistency and vagina normal without discharge Extremities: extremities normal, atraumatic, no cyanosis or edema Pulses: 2+ and symmetric Skin: Skin color, texture, turgor normal. No rashes or lesions Lymph nodes: Cervical, supraclavicular, and axillary nodes normal. Neurologic: Grossly normal    Assessment:    Healthy female exam.      Plan:      Problem List Items Addressed This Visit    None    Visit Diagnoses    Screening for malignant neoplasm of cervix       Encounter for gynecological examination without abnormal finding       Relevant Orders   Cytology - PAP     Has PCP for other health maintenance Not sexually active right now. Advised condoms if needed. Mammogram in 04/19 Return in 1 year (on 05/15/2019).  See After Visit Summary for Counseling Recommendations

## 2018-05-15 ENCOUNTER — Encounter: Payer: Self-pay | Admitting: Family Medicine

## 2018-05-15 LAB — CYTOLOGY - PAP
Diagnosis: NEGATIVE
HPV: NOT DETECTED

## 2018-05-28 ENCOUNTER — Telehealth: Payer: Self-pay

## 2018-05-28 NOTE — Telephone Encounter (Signed)
Pt called the office requesting results of her Pap smear. Pt informed that her Pap results were normal. Pt also requested a copy of her results. Informed pt that she will get a letter about her results. Understanding was voiced.

## 2018-12-26 DIAGNOSIS — K589 Irritable bowel syndrome without diarrhea: Secondary | ICD-10-CM | POA: Insufficient documentation

## 2018-12-26 DIAGNOSIS — R3129 Other microscopic hematuria: Secondary | ICD-10-CM | POA: Insufficient documentation

## 2018-12-26 DIAGNOSIS — J302 Other seasonal allergic rhinitis: Secondary | ICD-10-CM | POA: Insufficient documentation

## 2018-12-26 DIAGNOSIS — F419 Anxiety disorder, unspecified: Secondary | ICD-10-CM | POA: Insufficient documentation

## 2018-12-26 DIAGNOSIS — K219 Gastro-esophageal reflux disease without esophagitis: Secondary | ICD-10-CM

## 2018-12-26 HISTORY — DX: Gastro-esophageal reflux disease without esophagitis: K21.9

## 2019-04-02 IMAGING — MG DIGITAL SCREENING BILATERAL MAMMOGRAM WITH TOMO AND CAD
6 of 10 series · 6 of 30 positions shown · non-contrast
Comparison: Previous exam(s).

CLINICAL DATA: Screening.

EXAM:
DIGITAL SCREENING BILATERAL MAMMOGRAM WITH TOMO AND CAD

[R CC synth-2D]
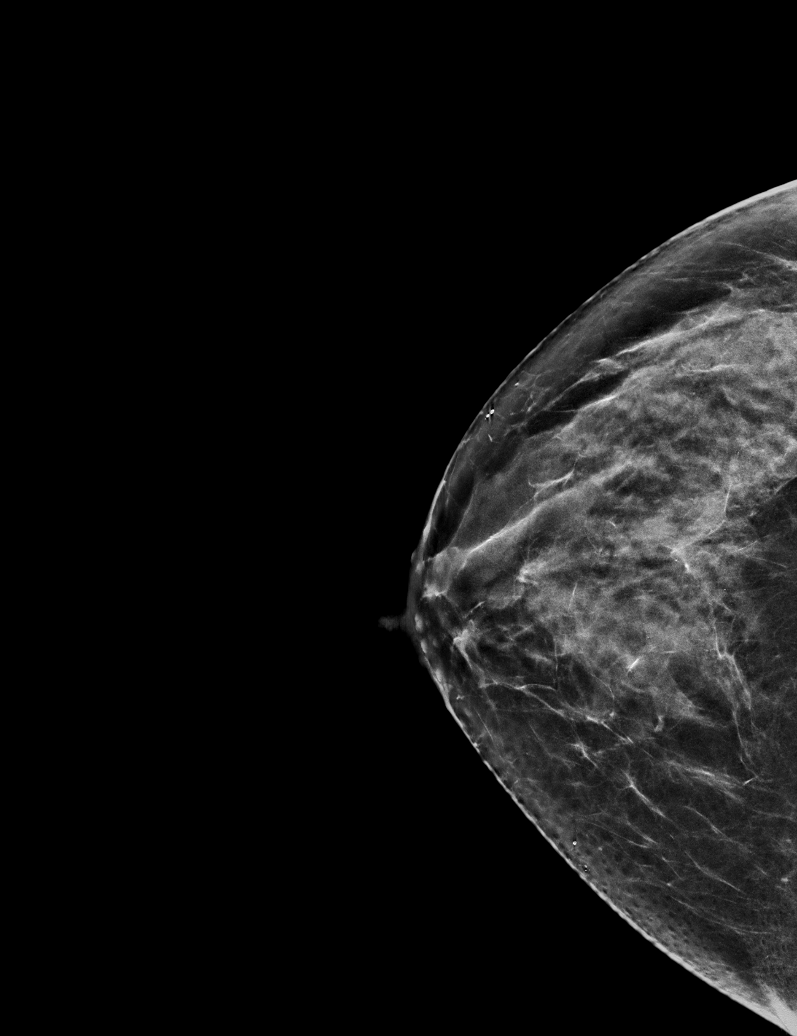

[L MLO synth-2D (1 of 2)]
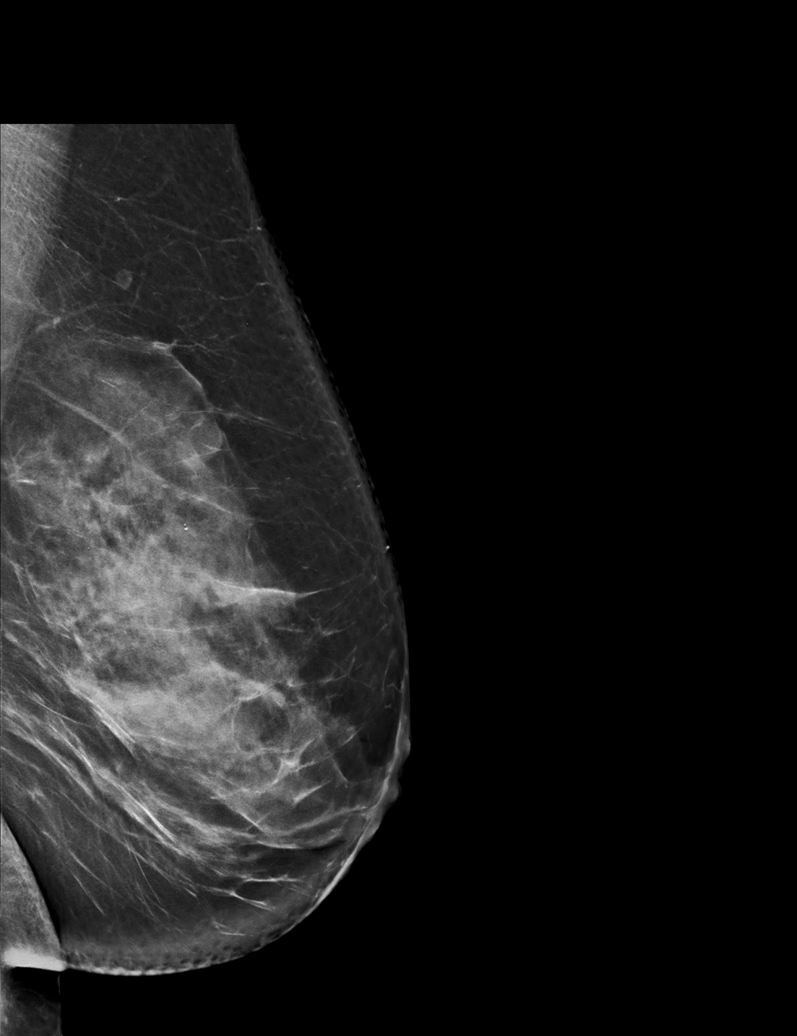

[R MLO synth-2D]
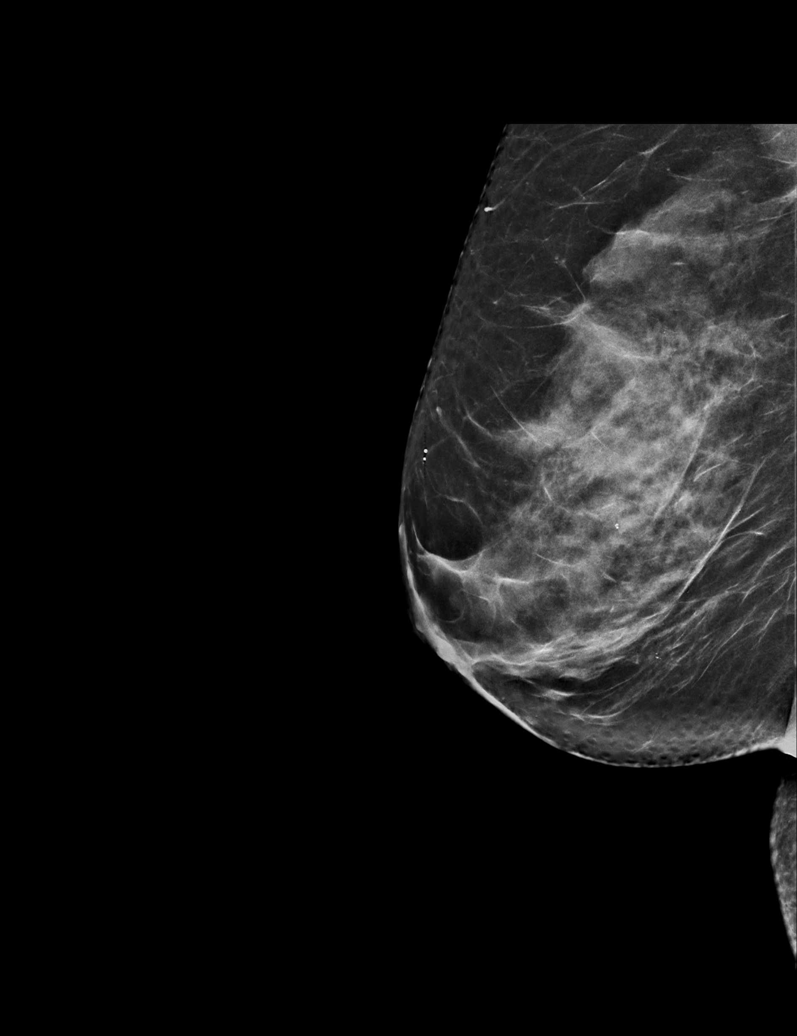

[L CC synth-2D]
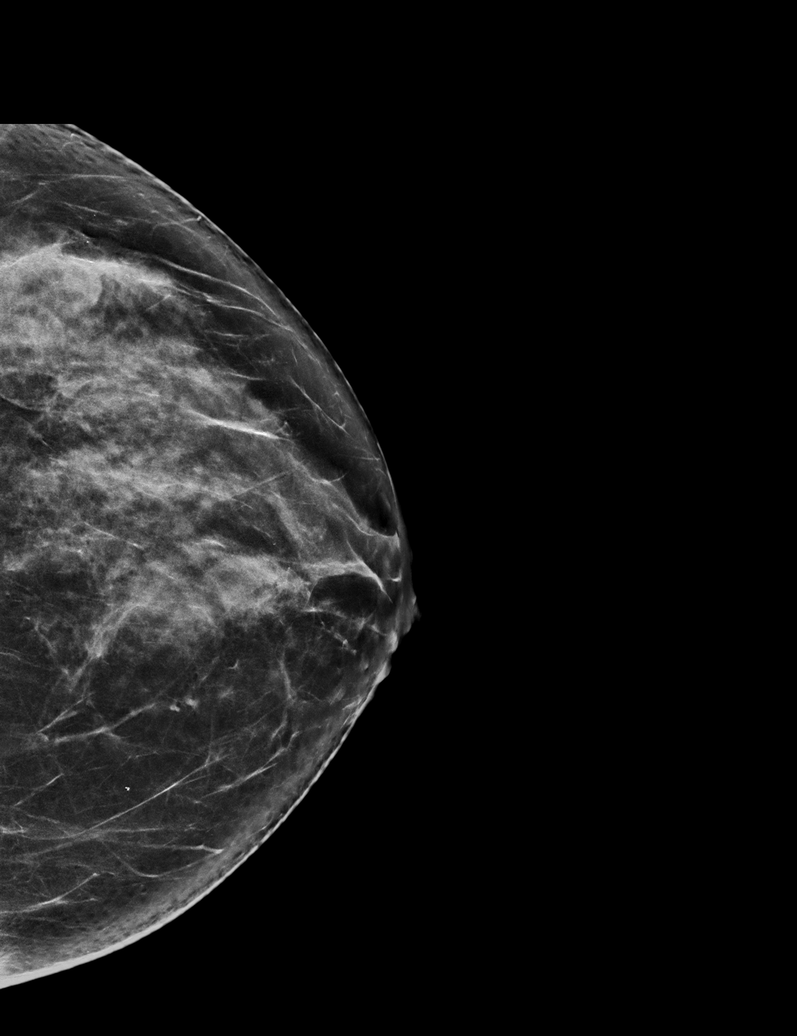

[L MLO synth-2D (2 of 2)]
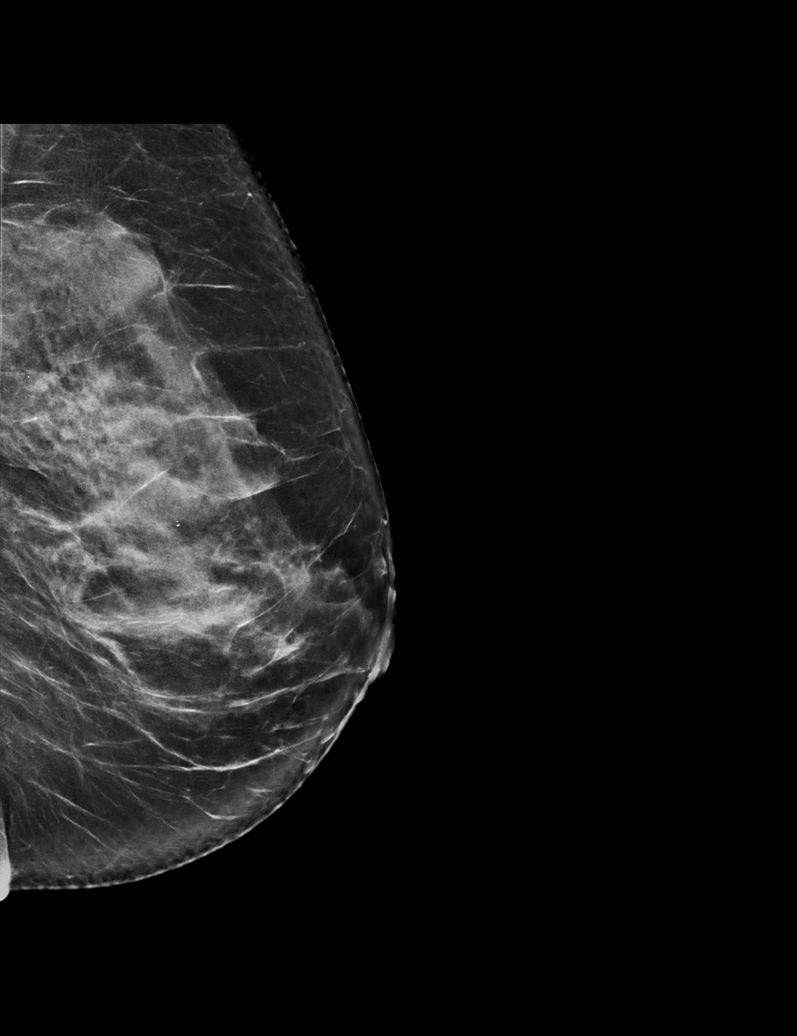

[R MLO tomo · tomo slice 37/72.0]
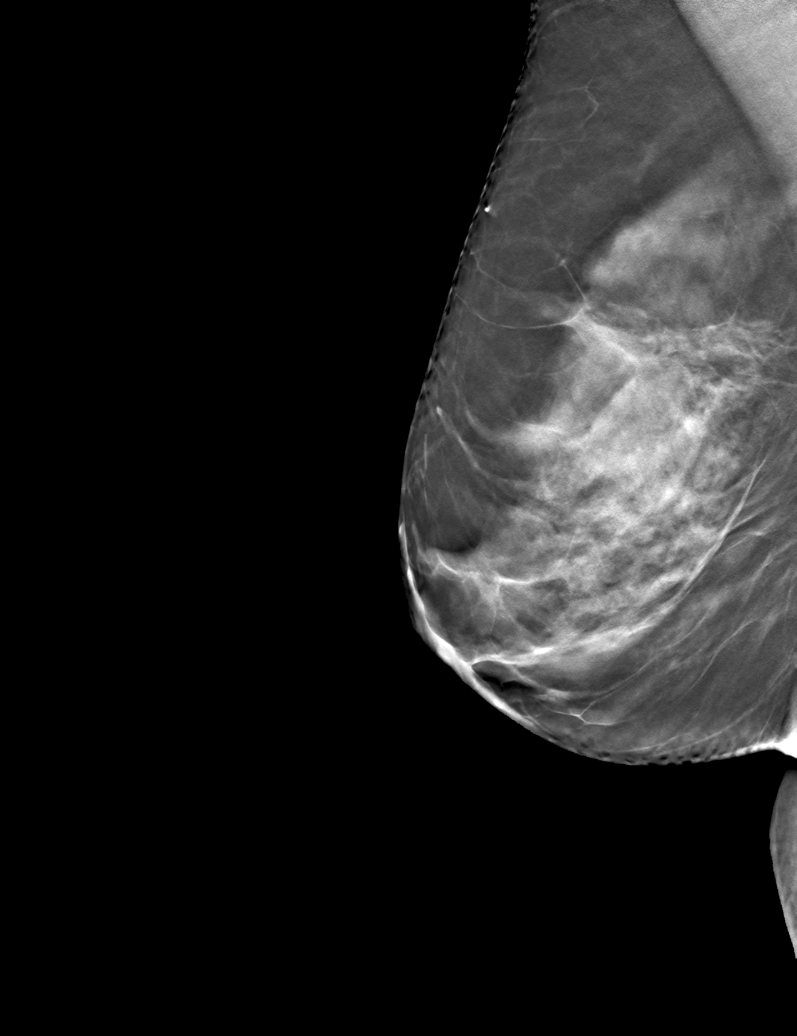

[6 of 30 positions shown; findings below may reference images not displayed]

ACR Breast Density Category c: The breast tissue is heterogeneously
dense, which may obscure small masses.
FINDINGS: In the left breast, a possible mass warrants further evaluation. In
the right breast, no findings suspicious for malignancy.

Images were processed with CAD.
IMPRESSION: Further evaluation is suggested for possible mass in the left
breast.

RECOMMENDATION:
Diagnostic mammogram and possibly ultrasound of the left breast.
(Code:IJ-[id number])

The patient will be contacted regarding the findings, and additional
imaging will be scheduled.

BI-RADS CATEGORY  0: Incomplete. Need additional imaging evaluation
and/or prior mammograms for comparison.

## 2019-05-18 NOTE — Progress Notes (Signed)
letter

## 2019-05-20 ENCOUNTER — Ambulatory Visit (INDEPENDENT_AMBULATORY_CARE_PROVIDER_SITE_OTHER): Payer: BLUE CROSS/BLUE SHIELD | Admitting: Family Medicine

## 2019-05-20 ENCOUNTER — Other Ambulatory Visit: Payer: Self-pay

## 2019-05-20 ENCOUNTER — Encounter: Payer: Self-pay | Admitting: Family Medicine

## 2019-05-20 VITALS — BP 114/73 | HR 71 | Ht 60.0 in | Wt 119.0 lb

## 2019-05-20 DIAGNOSIS — R102 Pelvic and perineal pain: Secondary | ICD-10-CM

## 2019-05-20 DIAGNOSIS — R1030 Lower abdominal pain, unspecified: Secondary | ICD-10-CM

## 2019-05-20 DIAGNOSIS — Z1151 Encounter for screening for human papillomavirus (HPV): Secondary | ICD-10-CM | POA: Diagnosis not present

## 2019-05-20 DIAGNOSIS — Z124 Encounter for screening for malignant neoplasm of cervix: Secondary | ICD-10-CM | POA: Diagnosis not present

## 2019-05-20 DIAGNOSIS — Z01419 Encounter for gynecological examination (general) (routine) without abnormal findings: Secondary | ICD-10-CM

## 2019-05-20 LAB — POCT URINALYSIS DIPSTICK
Glucose, UA: NEGATIVE
Leukocytes, UA: NEGATIVE
Nitrite, UA: NEGATIVE
Protein, UA: NEGATIVE
Spec Grav, UA: 1.015 (ref 1.010–1.025)
pH, UA: 6.5 (ref 5.0–8.0)

## 2019-05-20 MED ORDER — CIPROFLOXACIN HCL 500 MG PO TABS: 500.0000 mg | ORAL_TABLET | Freq: Two times a day (BID) | ORAL | 1 refills | Status: AC

## 2019-05-20 MED ORDER — NAPROXEN 500 MG PO TBEC: 500.0000 mg | DELAYED_RELEASE_TABLET | Freq: Two times a day (BID) | ORAL | 1 refills | Status: AC

## 2019-05-20 NOTE — Patient Instructions (Signed)
Preventive Care 40-48 Years Old, Female Preventive care refers to visits with your health care provider and lifestyle choices that can promote health and wellness. This includes:  A yearly physical exam. This may also be called an annual well check.  Regular dental visits and eye exams.  Immunizations.  Screening for certain conditions.  Healthy lifestyle choices, such as eating a healthy diet, getting regular exercise, not using drugs or products that contain nicotine and tobacco, and limiting alcohol use. What can I expect for my preventive care visit? Physical exam Your health care provider will check your:  Height and weight. This may be used to calculate body mass index (BMI), which tells if you are at a healthy weight.  Heart rate and blood pressure.  Skin for abnormal spots. Counseling Your health care provider may ask you questions about your:  Alcohol, tobacco, and drug use.  Emotional well-being.  Home and relationship well-being.  Sexual activity.  Eating habits.  Work and work environment.  Method of birth control.  Menstrual cycle.  Pregnancy history. What immunizations do I need?  Influenza (flu) vaccine  This is recommended every year. Tetanus, diphtheria, and pertussis (Tdap) vaccine  You may need a Td booster every 10 years. Varicella (chickenpox) vaccine  You may need this if you have not been vaccinated. Zoster (shingles) vaccine  You may need this after age 60. Measles, mumps, and rubella (MMR) vaccine  You may need at least one dose of MMR if you were born in 1957 or later. You may also need a second dose. Pneumococcal conjugate (PCV13) vaccine  You may need this if you have certain conditions and were not previously vaccinated. Pneumococcal polysaccharide (PPSV23) vaccine  You may need one or two doses if you smoke cigarettes or if you have certain conditions. Meningococcal conjugate (MenACWY) vaccine  You may need this if you  have certain conditions. Hepatitis A vaccine  You may need this if you have certain conditions or if you travel or work in places where you may be exposed to hepatitis A. Hepatitis B vaccine  You may need this if you have certain conditions or if you travel or work in places where you may be exposed to hepatitis B. Haemophilus influenzae type b (Hib) vaccine  You may need this if you have certain conditions. Human papillomavirus (HPV) vaccine  If recommended by your health care provider, you may need three doses over 6 months. You may receive vaccines as individual doses or as more than one vaccine together in one shot (combination vaccines). Talk with your health care provider about the risks and benefits of combination vaccines. What tests do I need? Blood tests  Lipid and cholesterol levels. These may be checked every 5 years, or more frequently if you are over 48 years old.  Hepatitis C test.  Hepatitis B test. Screening  Lung cancer screening. You may have this screening every year starting at age 48 if you have a 30-pack-year history of smoking and currently smoke or have quit within the past 15 years.  Colorectal cancer screening. All adults should have this screening starting at age 48 and continuing until age 75. Your health care provider may recommend screening at age 48 if you are at increased risk. You will have tests every 1-10 years, depending on your results and the type of screening test.  Diabetes screening. This is done by checking your blood sugar (glucose) after you have not eaten for a while (fasting). You may have this   done every 1-3 years.  Mammogram. This may be done every 1-2 years. Talk with your health care provider about when you should start having regular mammograms. This may depend on whether you have a family history of breast cancer.  BRCA-related cancer screening. This may be done if you have a family history of breast, ovarian, tubal, or peritoneal  cancers.  Pelvic exam and Pap test. This may be done every 3 years starting at age 48. Starting at age 30, this may be done every 5 years if you have a Pap test in combination with an HPV test. Other tests  Sexually transmitted disease (STD) testing.  Bone density scan. This is done to screen for osteoporosis. You may have this scan if you are at high risk for osteoporosis. Follow these instructions at home: Eating and drinking  Eat a diet that includes fresh fruits and vegetables, whole grains, lean protein, and low-fat dairy.  Take vitamin and mineral supplements as recommended by your health care provider.  Do not drink alcohol if: ? Your health care provider tells you not to drink. ? You are pregnant, may be pregnant, or are planning to become pregnant.  If you drink alcohol: ? Limit how much you have to 0-1 drink a day. ? Be aware of how much alcohol is in your drink. In the [place] one drink equals one 12 oz bottle of beer (355 mL), one 5 oz glass of wine (148 mL), or one 1 oz glass of hard liquor (44 mL). Lifestyle  Take daily care of your teeth and gums.  Stay active. Exercise for at least 30 minutes on 5 or more days each week.  Do not use any products that contain nicotine or tobacco, such as cigarettes, e-cigarettes, and chewing tobacco. If you need help quitting, ask your health care provider.  If you are sexually active, practice safe sex. Use a condom or other form of birth control (contraception) in order to prevent pregnancy and STIs (sexually transmitted infections).  If told by your health care provider, take low-dose aspirin daily starting at age 48. What's next?  Visit your health care provider once a year for a well check visit.  Ask your health care provider how often you should have your eyes and teeth checked.  Stay up to date on all vaccines. This information is not intended to replace advice given to you by your health care provider. Make sure you  discuss any questions you have with your health care provider. Document Released: 12/02/2015 Document Revised: 07/17/2018 Document Reviewed: 07/17/2018 Elsevier Patient Education  2020 Elsevier Inc.  

## 2019-05-20 NOTE — Progress Notes (Signed)
Subjective:     Deanna Valenzuela is a 48 y.o. female and is here for a comprehensive physical exam. The patient reports problems - some pelvic pain. She is having regular cycles. Notes pain from time to time.  Social History   Socioeconomic History  . Marital status: Married    Spouse name: Not on file  . Number of children: Not on file  . Years of education: Not on file  . Highest education level: Not on file  Occupational History  . Not on file  Social Needs  . Financial resource strain: Not on file  . Food insecurity    Worry: Not on file    Inability: Not on file  . Transportation needs    Medical: Not on file    Non-medical: Not on file  Tobacco Use  . Smoking status: Never Smoker  . Smokeless tobacco: Never Used  Substance and Sexual Activity  . Alcohol use: No  . Drug use: No  . Sexual activity: Not Currently  Lifestyle  . Physical activity    Days per week: Not on file    Minutes per session: Not on file  . Stress: Not on file  Relationships  . Social Herbalist on phone: Not on file    Gets together: Not on file    Attends religious service: Not on file    Active member of club or organization: Not on file    Attends meetings of clubs or organizations: Not on file    Relationship status: Not on file  . Intimate partner violence    Fear of current or ex partner: Not on file    Emotionally abused: Not on file    Physically abused: Not on file    Forced sexual activity: Not on file  Other Topics Concern  . Not on file  Social History Narrative  . Not on file   Health Maintenance  Topic Date Due  . INFLUENZA VACCINE  06/20/2019  . PAP SMEAR-Modifier  05/14/2021  . TETANUS/TDAP  11/19/2022  . HIV Screening  Completed    The following portions of the patient's history were reviewed and updated as appropriate: allergies, current medications, past family history, past medical history, past social history, past surgical history and problem  list.  Review of Systems Pertinent items noted in HPI and remainder of comprehensive ROS otherwise negative.   Objective:    BP 114/73   Pulse 71   Ht 5' (1.524 m)   Wt 119 lb (54 kg)   LMP 05/15/2019   BMI 23.24 kg/m  General appearance: alert, cooperative and appears stated age Head: Normocephalic, without obvious abnormality, atraumatic Neck: no adenopathy, supple, symmetrical, trachea midline and thyroid not enlarged, symmetric, no tenderness/mass/nodules Lungs: clear to auscultation bilaterally Breasts: normal appearance, no masses or tenderness Heart: regular rate and rhythm, S1, S2 normal, no murmur, click, rub or gallop Abdomen: soft, non-tender; bowel sounds normal; no masses,  no organomegaly Pelvic: cervix normal in appearance, external genitalia normal, no adnexal masses or tenderness, no cervical motion tenderness, uterus normal size, shape, and consistency and vagina normal without discharge Extremities: extremities normal, atraumatic, no cyanosis or edema Pulses: 2+ and symmetric Skin: Skin color, texture, turgor normal. No rashes or lesions Lymph nodes: Cervical, supraclavicular, and axillary nodes normal. Neurologic: Grossly normal    Assessment:    GYN female exam.      Plan:   Problem List Items Addressed This Visit    None  Visit Diagnoses    Pelvic pain    -  Primary   check pelvic sono   Relevant Orders   US PELVIC COMPLETE WITH TRANSVAGINAL   POCT urinalysis dipstick (Completed)   Screening for malignant neoplasm of cervix       wants yearly screen   Relevant Orders   Cytology - PAP( [place])   Encounter for gynecological examination without abnormal finding       no longer sexually active--condoms if needed   Lower abdominal pain       takes naprosyn, occasionally course of cipro--?diverticulitis--always worse in summoer-h/o IBS   Relevant Medications   ciprofloxacin (CIPRO) 500 MG tablet   naproxen (EC NAPROSYN) 500 MG EC tablet    Other Relevant Orders   POCT urinalysis dipstick (Completed)     Return in 1 year (on 05/19/2020).    See After Visit Summary for Counseling Recommendations

## 2019-05-21 ENCOUNTER — Telehealth: Payer: Self-pay

## 2019-05-21 ENCOUNTER — Ambulatory Visit (HOSPITAL_BASED_OUTPATIENT_CLINIC_OR_DEPARTMENT_OTHER)
Admission: RE | Admit: 2019-05-21 | Discharge: 2019-05-21 | Disposition: A | Discharge status: Home / Self Care | Payer: BLUE CROSS/BLUE SHIELD | Source: Ambulatory Visit | Attending: Family Medicine | Admitting: Family Medicine

## 2019-05-21 DIAGNOSIS — R102 Pelvic and perineal pain: Secondary | ICD-10-CM | POA: Insufficient documentation

## 2019-05-21 NOTE — Telephone Encounter (Signed)
Patient called and id'd by name and dob. Patient made aware that ultrasound with within normal limits. Patient states understanding Kathrene Alu RN

## 2019-05-21 NOTE — Telephone Encounter (Signed)
-----   Message from Donnamae Jude, MD sent at 05/21/2019 12:55 PM EDT ----- Her u/s is normal.

## 2019-05-26 LAB — CYTOLOGY - PAP
Diagnosis: NEGATIVE
HPV: NOT DETECTED

## 2019-06-15 ENCOUNTER — Telehealth: Payer: Self-pay

## 2019-06-15 NOTE — Telephone Encounter (Signed)
Pt called requesting Pap results. Pt made aware that Pap smear is WNL. Understanding was voiced.  chiquita l wilson, CMA
